# Patient Record
Sex: Female | Born: 1980 | Race: Black or African American | Hispanic: No | Marital: Single | Smoking: Never smoker
Health system: Southern US, Community
[De-identification: ages and names within clinical notes are randomized; demographics above are authoritative.]

## PROBLEM LIST (undated history)

## (undated) DIAGNOSIS — I10 Essential (primary) hypertension: Secondary | ICD-10-CM

---

## 2001-11-11 ENCOUNTER — Ambulatory Visit (HOSPITAL_COMMUNITY): Admission: RE | Admit: 2001-11-11 | Discharge: 2001-11-11 | Payer: Self-pay | Admitting: *Deleted

## 2002-01-07 ENCOUNTER — Inpatient Hospital Stay (HOSPITAL_COMMUNITY): Admission: AD | Admit: 2002-01-07 | Discharge: 2002-01-07 | Payer: Self-pay | Admitting: *Deleted

## 2002-01-19 ENCOUNTER — Encounter: Payer: Self-pay | Admitting: *Deleted

## 2002-01-19 ENCOUNTER — Inpatient Hospital Stay (HOSPITAL_COMMUNITY): Admission: AD | Admit: 2002-01-19 | Discharge: 2002-01-21 | Payer: Self-pay | Admitting: *Deleted

## 2002-02-17 ENCOUNTER — Inpatient Hospital Stay (HOSPITAL_COMMUNITY): Admission: AD | Admit: 2002-02-17 | Discharge: 2002-02-20 | Payer: Self-pay | Admitting: Obstetrics and Gynecology

## 2003-07-29 ENCOUNTER — Ambulatory Visit (HOSPITAL_COMMUNITY): Admission: RE | Admit: 2003-07-29 | Discharge: 2003-07-29 | Payer: Self-pay | Admitting: *Deleted

## 2003-09-19 ENCOUNTER — Inpatient Hospital Stay (HOSPITAL_COMMUNITY): Admission: AD | Admit: 2003-09-19 | Discharge: 2003-09-19 | Payer: Self-pay | Admitting: *Deleted

## 2003-10-06 ENCOUNTER — Inpatient Hospital Stay (HOSPITAL_COMMUNITY): Admission: AD | Admit: 2003-10-06 | Discharge: 2003-10-13 | Payer: Self-pay | Admitting: *Deleted

## 2003-10-06 ENCOUNTER — Ambulatory Visit: Payer: Self-pay | Admitting: *Deleted

## 2004-03-22 ENCOUNTER — Ambulatory Visit: Payer: Self-pay | Admitting: Family Medicine

## 2004-08-30 ENCOUNTER — Ambulatory Visit: Payer: Self-pay | Admitting: Family Medicine

## 2005-06-15 IMAGING — US US OB FOLLOW-UP
1 series · 13 of 28 positions shown · non-contrast
Comparison: none

CLINICAL DATA: 22-year-old.  G3 P1.  Check fluid.  Tachycardia at 33 weeks.

[Series 1: unknown · 0.32mm/px · 13 of 39 slices shown]
[im 2/39]
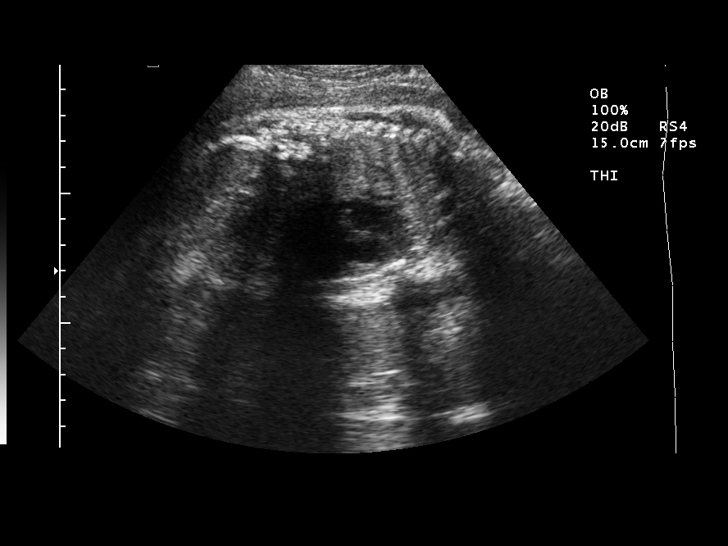
[im 5/39]
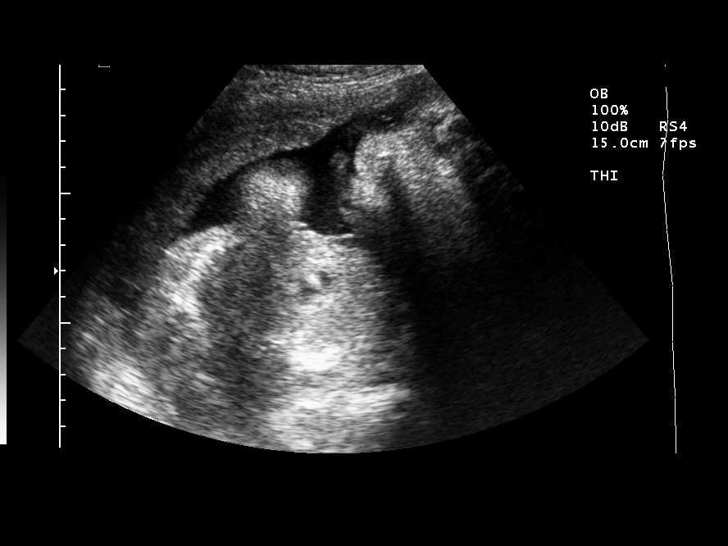
[im 8/39]
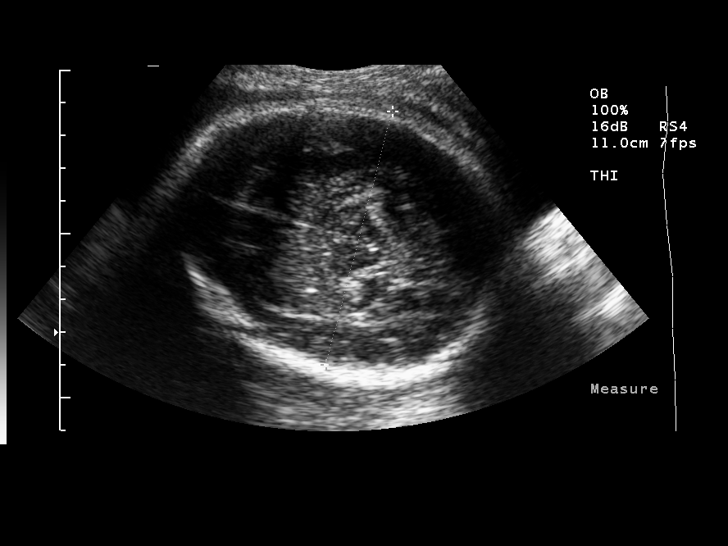
[im 10/39]
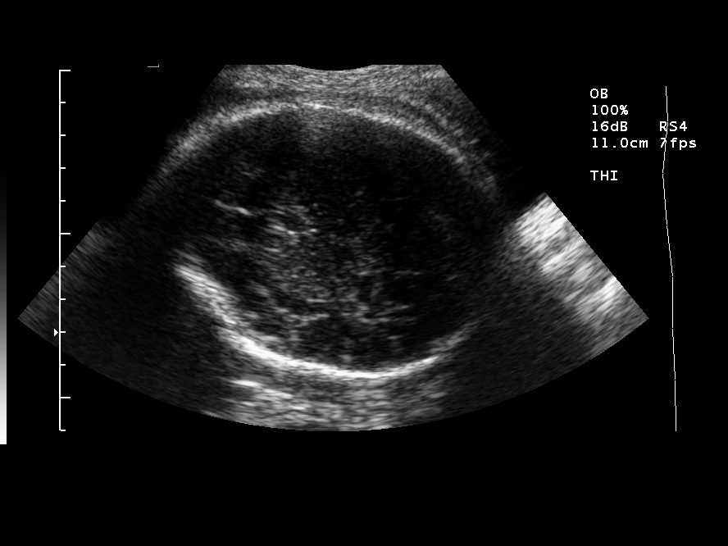
[im 13/39]
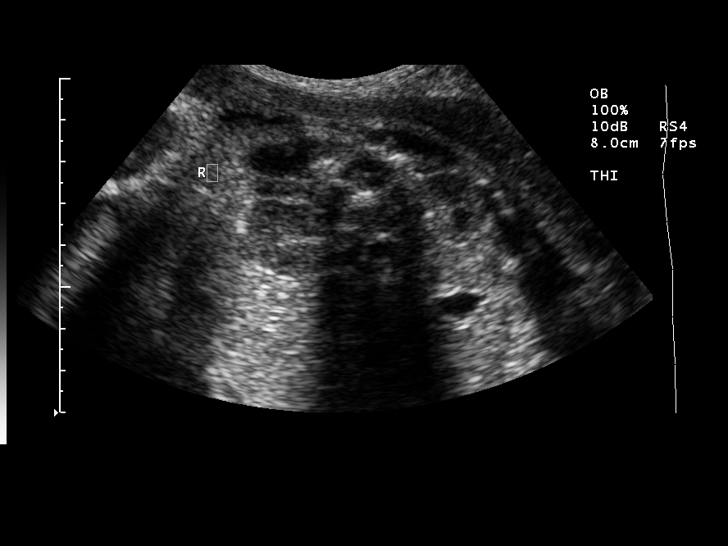
[im 16/39]
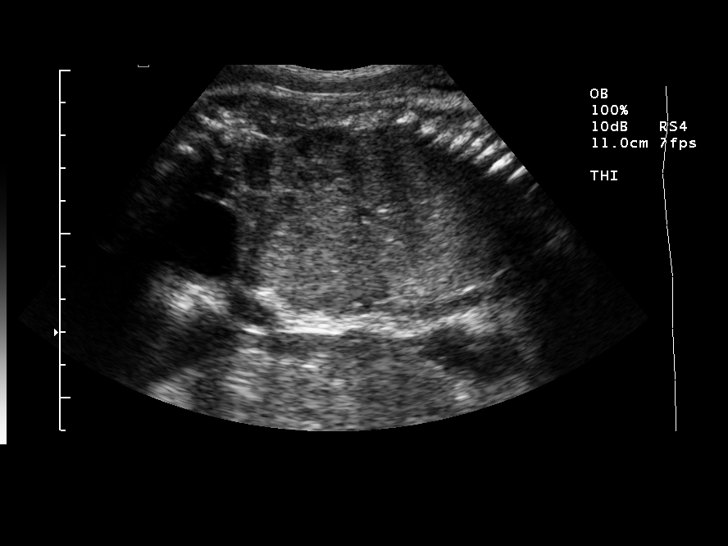
[im 20/39]
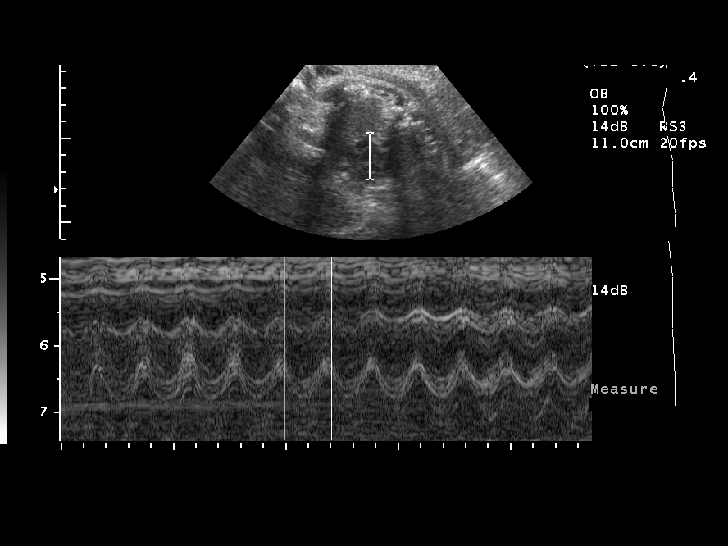
[im 23/39]
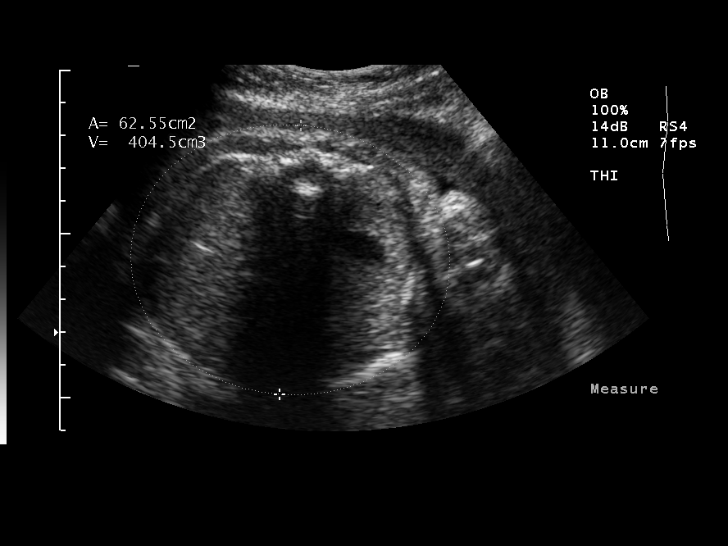
[im 26/39]
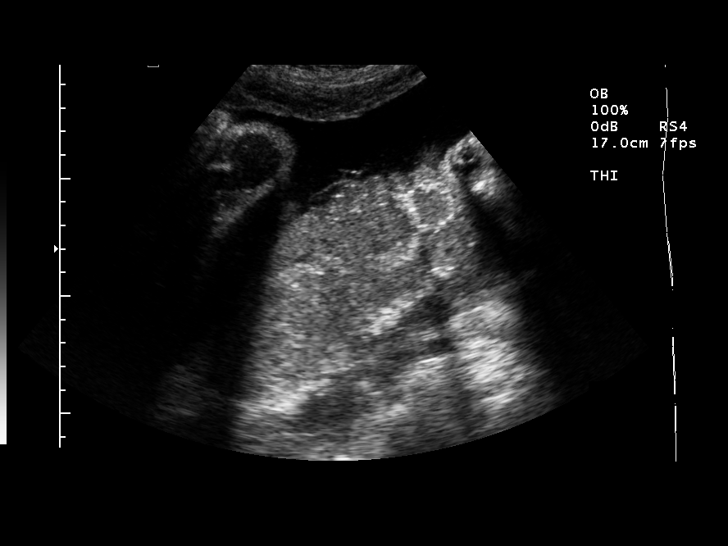
[im 29/39]
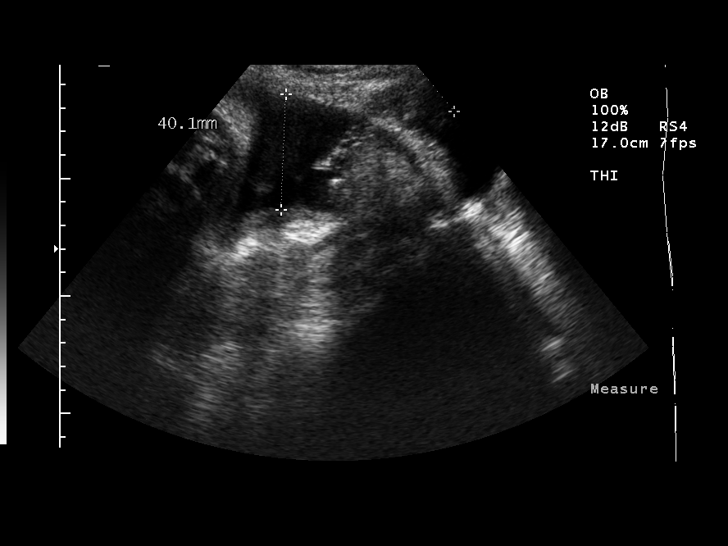
[im 31/39]
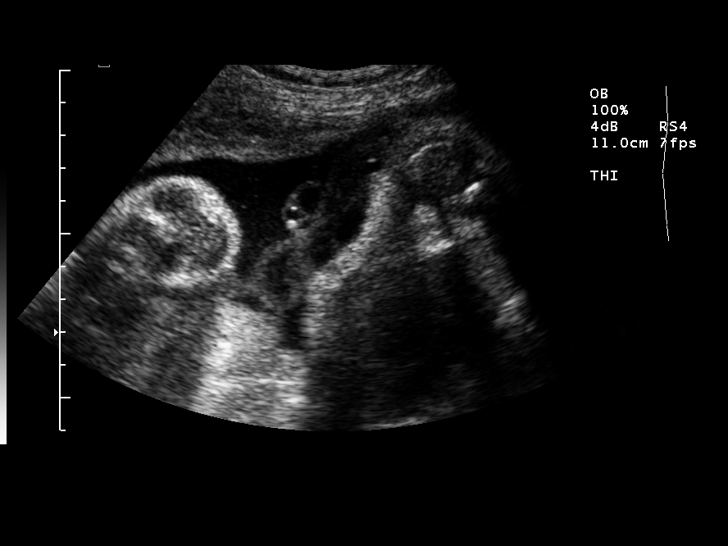
[im 34/39]
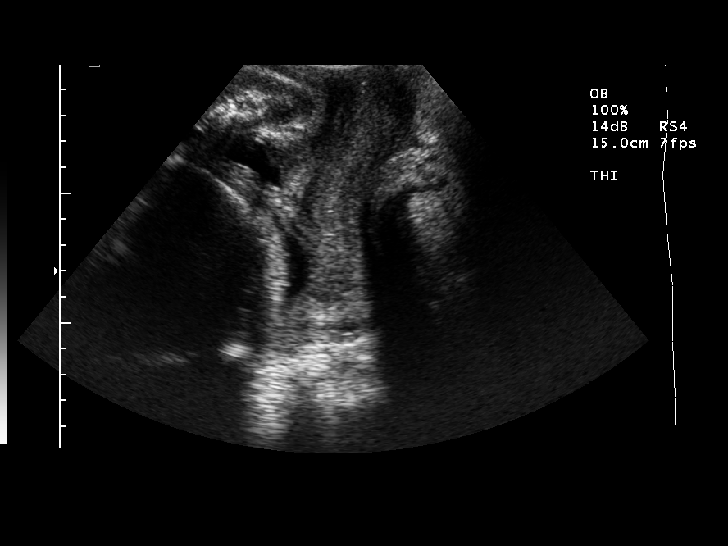
[im 37/39]
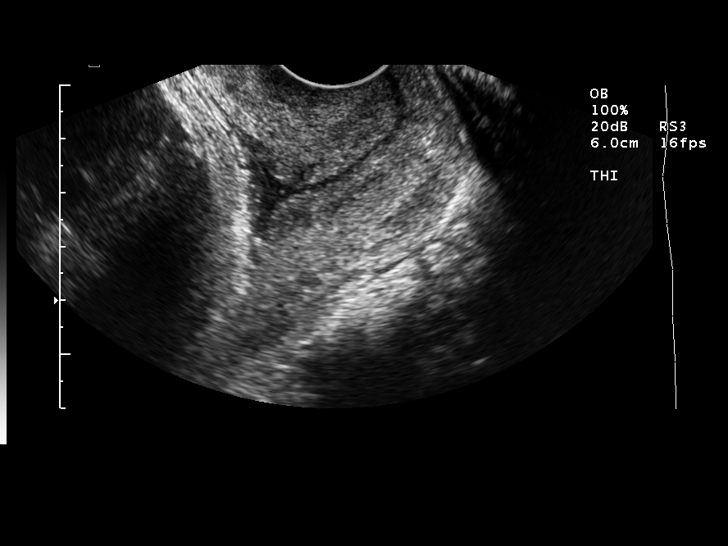

[13 of 28 positions shown; findings below may reference images not displayed]

OBSTETRICAL ULTRASOUND RE-EVALUATION WITH TRANSVAGINAL:
 Number of Fetuses:  1
 Heart Rate:  145/155
 Movement:  Yes
 Breathing:  No
 Presentation:  Cephalic
 Placental Location:  Posterior
 Grade:  II
 Previa:  No
 Amniotic Fluid (subjective):  Normal
 Amniotic Fluid (objective):  12.2 cm AFI (5th -95th%ile = 8.3 – 24.5 cm for 33 wks)

 FETAL BIOMETRY
 BPD:  8.0 cm  32 w 0 d
 HC:  31.0 cm   34 w 5 d
 AC:  27.6 cm   31 w 5 d
 FL:   5.9 cm   30 w 6 d

 Mean GA:  32 w 2 d
 Assigned GA:  33 w 3 d

 EFW:  0100 g (H) 25th – 50th%ile (1444 – 8388 g) For 33 wks

 FETAL ANATOMY
 Lateral Ventricles:    Visualized 
 Thalami/CSP:  Visualized 
 Posterior Fossa:  Previously seen 
 Nuchal Region:  N/A
 Spine:  Previously seen 
 4 Chamber Heart on Left:  Visualized 
 Stomach on Left:  Visualized 
 3 Vessel Cord:  Visualized 
 Cord Insertion Site:  Previously seen 
 Kidneys:  Visualized 
 Bladder:  Visualized 
 Extremities:  Previously seen 

 MATERNAL FINDINGS
 Cervix:  2.9 cm Transvaginally
IMPRESSION: Single living intrauterine fetus in cephalic presentation. Amniotic fluid volume is within normal limits.  Estimated fetal weight is in the 25th to 50th percentile.  Cervical length is slightly shortened.  No evidence for fetal tachycardia at time of exam.

## 2015-03-22 ENCOUNTER — Emergency Department
Admission: EM | Admit: 2015-03-22 | Discharge: 2015-03-22 | Disposition: A | Discharge status: Home / Self Care | Payer: 59 | Source: Home / Self Care | Attending: Family Medicine | Admitting: Family Medicine

## 2015-03-22 ENCOUNTER — Emergency Department
Admission: EM | Admit: 2015-03-22 | Discharge: 2015-03-22 | Disposition: A | Discharge status: Home / Self Care | Payer: Self-pay | Source: Home / Self Care

## 2015-03-22 ENCOUNTER — Encounter: Payer: Self-pay | Admitting: *Deleted

## 2015-03-22 DIAGNOSIS — Z9119 Patient's noncompliance with other medical treatment and regimen: Secondary | ICD-10-CM

## 2015-03-22 DIAGNOSIS — I10 Essential (primary) hypertension: Secondary | ICD-10-CM

## 2015-03-22 DIAGNOSIS — R52 Pain, unspecified: Secondary | ICD-10-CM

## 2015-03-22 DIAGNOSIS — Z91148 Patient's other noncompliance with medication regimen for other reason: Secondary | ICD-10-CM

## 2015-03-22 DIAGNOSIS — Z9114 Patient's other noncompliance with medication regimen: Secondary | ICD-10-CM

## 2015-03-22 DIAGNOSIS — R6889 Other general symptoms and signs: Secondary | ICD-10-CM

## 2015-03-22 DIAGNOSIS — R000 Tachycardia, unspecified: Secondary | ICD-10-CM

## 2015-03-22 HISTORY — DX: Essential (primary) hypertension: I10

## 2015-03-22 LAB — POCT INFLUENZA A/B
Influenza A, POC: NEGATIVE
Influenza B, POC: NEGATIVE

## 2015-03-22 MED ORDER — AMLODIPINE BESYLATE 5 MG PO TABS: 5.0000 mg | ORAL_TABLET | Freq: Every day | ORAL | Status: AC

## 2015-03-22 MED ORDER — OSELTAMIVIR PHOSPHATE 75 MG PO CAPS: 75.0000 mg | ORAL_CAPSULE | Freq: Two times a day (BID) | ORAL | Status: AC

## 2015-03-22 MED ORDER — IBUPROFEN 400 MG PO TABS
400.0000 mg | ORAL_TABLET | Freq: Once | ORAL | Status: AC
  Administered 2015-03-22: 400 mg via ORAL

## 2015-03-22 NOTE — ED Provider Notes (Signed)
CSN: [id number]     Arrival date & time 03/22/15  1959 History   First MD Initiated Contact with Patient 03/22/15 2028     Chief Complaint  Patient presents with  . Generalized Body Aches   (Consider location/radiation/quality/duration/timing/severity/associated sxs/prior Treatment) HPI  The pt is a 34yo female brought to [facility] by her mother with c/o fatigue and generalized body aches that started yesterday.  She has not taken any pain medication including acetaminophen or ibuprofen for her pain.  She notes her son was sick earlier this week with fever and vomiting but is now better.  She has not received the flu vaccine this year and is interested in trying Tamiflu.  She denies cough, congestion, n/v/d. Denies chest pain or SOB. No hx of asthma. Denies urinary symptoms. Denies recent travel or rashes.   Elevated blood pressure in triage.  Pt reports hx of HTN but notes she took herself off blood pressure medication about 2 years ago.  She does have a mild generalized headache but denies change in vision, chest pain, or extremity weakness or numbness.   Past Medical History  Diagnosis Date  . Hypertension    History reviewed. No pertinent past surgical history. History reviewed. No pertinent family history. Social History  Substance Use Topics  . Smoking status: Never Smoker   . Smokeless tobacco: None  . Alcohol Use: No   OB History    No data available     Review of Systems  Constitutional: Positive for fever, chills, appetite change and fatigue. Negative for diaphoresis.  HENT: Negative for congestion, rhinorrhea, sinus pressure and sore throat.   Respiratory: Negative for cough and shortness of breath.   Cardiovascular: Negative for chest pain and palpitations.  Gastrointestinal: Negative for vomiting, abdominal pain and diarrhea.  Genitourinary: Negative for dysuria, urgency, frequency, hematuria, flank pain and pelvic pain.  Musculoskeletal: Positive for myalgias and  arthralgias.       Body aches  Skin: Negative for color change and rash.  Neurological: Positive for headaches. Negative for dizziness, seizures, syncope, weakness, light-headedness and numbness.    Allergies  Review of patient's allergies indicates no known allergies.  Home Medications   Prior to Admission medications   Medication Sig Start Date End Date Taking? Authorizing Provider  amLODipine (NORVASC) 5 MG tablet Take 1 tablet (5 mg total) by mouth daily. 03/22/15   Junius Finner, PA-C  oseltamivir (TAMIFLU) 75 MG capsule Take 1 capsule (75 mg total) by mouth every 12 (twelve) hours. 03/22/15   Junius Finner, PA-C   Meds Ordered and Administered this Visit   Medications  ibuprofen (ADVIL,MOTRIN) tablet 400 mg (400 mg Oral Given 03/22/15 2037)    BP 163/112 mmHg  Pulse 119  Temp(Src) 100.4 F (38 C) (Oral)  Resp 16  Ht  (1.575 m)  Wt 110 lb (49.896 kg)  BMI 20.11 kg/m2  SpO2 99% No data found.   Physical Exam  Constitutional: She is oriented to person, place, and time. She appears well-developed and well-nourished. No distress.  Pt lying on exam table, appears mildly fatigued but alert. Cooperative during exam.   HENT:  Head: Normocephalic and atraumatic.  Right Ear: Tympanic membrane normal.  Left Ear: Tympanic membrane normal.  Nose: Mucosal edema present.  Mouth/Throat: Uvula is midline, oropharynx is clear and moist and mucous membranes are normal. No oropharyngeal exudate.  Eyes: Conjunctivae and EOM are normal. Pupils are equal, round, and reactive to light. No scleral icterus.  Neck: Normal range of  motion. Neck supple.  Cardiovascular: Regular rhythm and normal heart sounds.  Tachycardia present.   Tachycardia, regular rhythm  Pulmonary/Chest: Effort normal and breath sounds normal. No stridor. No respiratory distress. She has no wheezes. She has no rales.  Lungs: CTAB. No wheeze or rhonchi. No respiratory distress, able to speak in full sentences w/o  difficulty.   Abdominal: Soft. She exhibits no distension and no mass. There is no tenderness. There is no rebound and no guarding.  Musculoskeletal: Normal range of motion.  Lymphadenopathy:    She has no cervical adenopathy.  Neurological: She is alert and oriented to person, place, and time. Gait normal.  Speech is clear. Alert to person, place and time. Able to follow 2 step commands.   Skin: Skin is warm and dry. No rash noted. She is not diaphoretic. No erythema.  Nursing note and vitals reviewed.   ED Course  Procedures (including critical care time)  Labs Review Labs Reviewed  POCT INFLUENZA A/B    Imaging Review No results found.   MDM   1. Body aches   2. Flu-like symptoms   3. Essential hypertension   4. H/O medication noncompliance   5. Tachycardia    Pt c/o body aches and fatigue that started yesterday. Son sick earlier this week with GI symptoms. Pt denies GI symptoms. She did not receive flu vaccine this year but is willing to try Tamiflu even if Rapid flu is negative as test can have up to 50% false negative.    BP elevated, hx of HTN. She took herself off BP medication 2 years ago.  Mild generalized headache. Normal neuro exam.  HA was gradual in onset. Doubt SAH. No nuchal rigidity. No signs of meningitis.   Tachycardia with Temp of 100.4*F  HR gradually improving from 124 to 119 after pt given ibuprofen and ice water.  Pt denies chest pain or SOB. O2 Sat 99% on RA.  Doubt PE.  Pt likely has a viral illness, however, strongly encouraged pt to go to emergency department tonight if HA continues to worsen or other new concerning symptoms develop including chest pain or SOB as she may benefit from IV fluids and stat labs.  Rx: Tamiflu and amlodipine   Advised pt to use acetaminophen and ibuprofen as needed for fever and pain. Encouraged rest and fluids. F/u with PCP as soon as possible for close monitoring of her BP and for baseline routine labs if she does not  go to emergency department tonight. Pt, pt's mother, and her husband verbalized understanding and agreement with tx plan.     Junius Finner, PA-C 03/22/15 2224

## 2015-03-22 NOTE — Discharge Instructions (Signed)
You may take 400-600mg Ibuprofen (Motrin) every 6-8 hours for fever and pain  °Alternate with Tylenol  °You may take 500mg Tylenol every 4-6 hours as needed for fever and pain  °Follow-up with your primary care provider next week for recheck of symptoms if not improving.  °Be sure to drink plenty of fluids and rest, at least 8hrs of sleep a night, preferably more while you are sick. °Return urgent care or go to closest ER if you cannot keep down fluids/signs of dehydration, fever not reducing with Tylenol, difficulty breathing/wheezing, stiff neck, worsening condition, or other concerns (see below)  °Please take Tamiflu as prescribed and be sure to complete entire course even if you start to feel better to ensure infection does not come back. ° ° ° °

## 2015-03-22 NOTE — Plan of Care (Cosign Needed)
Called pt's home number.  Left message. Encouraged pt again to go to emergency department if her headache is not improving after ibuprofen and acetaminophen.  She may benefit from IV fluids and stat labs.  Encouraged to call urgent care in the morning with any questions or concerns.

## 2015-03-22 NOTE — ED Notes (Signed)
Pt c/o body aches, chills, and fever x 1 day.

## 2015-03-23 ENCOUNTER — Telehealth: Payer: Self-pay | Admitting: *Deleted

## 2015-04-04 ENCOUNTER — Emergency Department (HOSPITAL_BASED_OUTPATIENT_CLINIC_OR_DEPARTMENT_OTHER)
Admission: EM | Admit: 2015-04-04 | Discharge: 2015-04-04 | Disposition: A | Discharge status: Home / Self Care | Payer: 59 | Attending: Physician Assistant | Admitting: Physician Assistant

## 2015-04-04 ENCOUNTER — Encounter (HOSPITAL_BASED_OUTPATIENT_CLINIC_OR_DEPARTMENT_OTHER): Payer: Self-pay | Admitting: *Deleted

## 2015-04-04 DIAGNOSIS — G43809 Other migraine, not intractable, without status migrainosus: Secondary | ICD-10-CM | POA: Diagnosis not present

## 2015-04-04 DIAGNOSIS — R51 Headache: Secondary | ICD-10-CM | POA: Diagnosis present

## 2015-04-04 DIAGNOSIS — I10 Essential (primary) hypertension: Secondary | ICD-10-CM

## 2015-04-04 DIAGNOSIS — G8929 Other chronic pain: Secondary | ICD-10-CM | POA: Diagnosis not present

## 2015-04-04 DIAGNOSIS — Z79899 Other long term (current) drug therapy: Secondary | ICD-10-CM | POA: Insufficient documentation

## 2015-04-04 MED ORDER — SODIUM CHLORIDE 0.9 % IV BOLUS (SEPSIS)
1000.0000 mL | Freq: Once | INTRAVENOUS | Status: AC
  Administered 2015-04-04: 1000 mL via INTRAVENOUS

## 2015-04-04 MED ORDER — DIPHENHYDRAMINE HCL 50 MG/ML IJ SOLN
25.0000 mg | Freq: Once | INTRAMUSCULAR | Status: AC
  Administered 2015-04-04: 25 mg via INTRAVENOUS
  Filled 2015-04-04: qty 1

## 2015-04-04 MED ORDER — PROCHLORPERAZINE EDISYLATE 5 MG/ML IJ SOLN
10.0000 mg | Freq: Once | INTRAMUSCULAR | Status: AC
  Administered 2015-04-04: 10 mg via INTRAVENOUS
  Filled 2015-04-04: qty 2

## 2015-04-04 MED ORDER — KETOROLAC TROMETHAMINE 30 MG/ML IJ SOLN
30.0000 mg | Freq: Once | INTRAMUSCULAR | Status: AC
  Administered 2015-04-04: 30 mg via INTRAVENOUS
  Filled 2015-04-04: qty 1

## 2015-04-04 NOTE — Discharge Instructions (Signed)
How to Take Your Blood Pressure °HOW DO I GET A BLOOD PRESSURE MACHINE? °· You can buy an electronic home blood pressure machine at your local pharmacy. Insurance will sometimes cover the cost if you have a prescription. °· Ask your doctor what type of machine is best for you. There are different machines for your arm and your wrist. °· If you decide to buy a machine to check your blood pressure on your arm, first check the size of your arm so you can buy the right size cuff. To check the size of your arm:   °· Use a measuring tape that shows both inches and centimeters.   °· Wrap the measuring tape around the upper-middle part of your arm. You may need someone to help you measure.   °· Write down your arm measurement in both inches and centimeters.   °· To measure your blood pressure correctly, it is important to have the right size cuff.   °· If your arm is up to 13 inches (up to 34 centimeters), get an adult cuff size. °· If your arm is 13 to 17 inches (35 to 44 centimeters), get a large adult cuff size.   °·  If your arm is 17 to 20 inches (45 to 52 centimeters), get an adult thigh cuff.   °WHAT DO THE NUMBERS MEAN?  °· There are two numbers that make up your blood pressure. For example: 120/80. °· The first number (120 in our example) is called the "systolic pressure." It is a measure of the pressure in your blood vessels when your heart is pumping blood. °· The second number (80 in our example) is called the "diastolic pressure." It is a measure of the pressure in your blood vessels when your heart is resting between beats. °· Your doctor will tell you what your blood pressure should be. °WHAT SHOULD I DO BEFORE I CHECK MY BLOOD PRESSURE?  °· Try to rest or relax for at least 30 minutes before you check your blood pressure. °· Do not smoke. °· Do not have any drinks with caffeine, such as: °· Soda. °· Coffee. °· Tea. °· Check your blood pressure in a quiet room. °· Sit down and stretch out your arm on a table.  Keep your arm at about the level of your heart. Let your arm relax. °· Make sure that your legs are not crossed. °HOW DO I CHECK MY BLOOD PRESSURE? °· Follow the directions that came with your machine. °· Make sure you remove any tight-fitting clothing from your arm or wrist. Wrap the cuff around your upper arm or wrist. You should be able to fit a finger between the cuff and your arm. If you cannot fit a finger between the cuff and your arm, it is too tight and should be removed and rewrapped. °· Some units require you to manually pump up the arm cuff. °· Automatic units inflate the cuff when you press a button. °· Cuff deflation is automatic in both models. °· After the cuff is inflated, the unit measures your blood pressure and pulse. The readings are shown on a monitor. Hold still and breathe normally while the cuff is inflated. °· Getting a reading takes less than a minute. °· Some models store readings in a memory. Some provide a printout of readings. If your machine does not store your readings, keep a written record. °· Take readings with you to your next visit with your doctor. °  °This information is not intended to replace advice given to   you by your health care provider. Make sure you discuss any questions you have with your health care provider. °  °Document Released: 01/04/2008 Document Revised: 02/11/2014 Document Reviewed: 03/18/2013 °Elsevier Interactive Patient Education ©2016 [employer] Inc[employer] ° °Hypertension °Hypertension, commonly called high blood pressure, is when the force of blood pumping through your arteries is too strong. Your arteries are the blood vessels that carry blood from your heart throughout your body. A blood pressure reading consists of a higher number over a lower number, such as 110/72. The higher number (systolic) is the pressure inside your arteries when your heart pumps. The lower number (diastolic) is the pressure inside your arteries when your heart relaxes. Ideally you want  your blood pressure below 120/80. °Hypertension forces your heart to work harder to pump blood. Your arteries may become narrow or stiff. Having untreated or uncontrolled hypertension can cause heart attack, stroke, kidney disease, and other problems. °RISK FACTORS °Some risk factors for high blood pressure are controllable. Others are not.  °Risk factors you cannot control include:  °· Race. You may be at higher risk if you are African American. °· Age. Risk increases with age. °· Gender. Men are at higher risk than women before age 45 years. After age 65, women are at higher risk than men. °Risk factors you can control include: °· Not getting enough exercise or physical activity. °· Being overweight. °· Getting too much fat, sugar, calories, or salt in your diet. °· Drinking too much alcohol. °SIGNS AND SYMPTOMS °Hypertension does not usually cause signs or symptoms. Extremely high blood pressure (hypertensive crisis) may cause headache, anxiety, shortness of breath, and nosebleed. °DIAGNOSIS °To check if you have hypertension, your health care provider will measure your blood pressure while you are seated, with your arm held at the level of your heart. It should be measured at least twice using the same arm. Certain conditions can cause a difference in blood pressure between your right and left arms. A blood pressure reading that is higher than normal on one occasion does not mean that you need treatment. If it is not clear whether you have high blood pressure, you may be asked to return on a different day to have your blood pressure checked again. Or, you may be asked to monitor your blood pressure at home for 1 or more weeks. °TREATMENT °Treating high blood pressure includes making lifestyle changes and possibly taking medicine. Living a healthy lifestyle can help lower high blood pressure. You may need to change some of your habits. °Lifestyle changes may include: °· Following the DASH diet. This diet is high  in fruits, vegetables, and whole grains. It is low in salt, red meat, and added sugars. °· Keep your sodium intake below 2,300 mg per day. °· Getting at least 30-45 minutes of aerobic exercise at least 4 times per week. °· Losing weight if necessary. °· Not smoking. °· Limiting alcoholic beverages. °· Learning ways to reduce stress. °Your health care provider may prescribe medicine if lifestyle changes are not enough to get your blood pressure under control, and if one of the following is true: °· You are 18-59 years of age and your systolic blood pressure is above 140. °· You are 60 years of age or older, and your systolic blood pressure is above 150. °· Your diastolic blood pressure is above 90. °· You have diabetes, and your systolic blood pressure is over 140 or your diastolic blood pressure is over 90. °· You have kidney disease   and your blood pressure is above 140/90. °· You have heart disease and your blood pressure is above 140/90. °Your personal target blood pressure may vary depending on your medical conditions, your age, and other factors. °HOME CARE INSTRUCTIONS °· Have your blood pressure rechecked as directed by your health care provider.   °· Take medicines only as directed by your health care provider. Follow the directions carefully. Blood pressure medicines must be taken as prescribed. The medicine does not work as well when you skip doses. Skipping doses also puts you at risk for problems. °· Do not smoke.   °· Monitor your blood pressure at home as directed by your health care provider.  °SEEK MEDICAL CARE IF:  °· You think you are having a reaction to medicines taken. °· You have recurrent headaches or feel dizzy. °· You have swelling in your ankles. °· You have trouble with your vision. °SEEK IMMEDIATE MEDICAL CARE IF: °· You develop a severe headache or confusion. °· You have unusual weakness, numbness, or feel faint. °· You have severe chest or abdominal pain. °· You vomit repeatedly. °· You  have trouble breathing. °MAKE SURE YOU:  °· Understand these instructions. °· Will watch your condition. °· Will get help right away if you are not doing well or get worse. °  °This information is not intended to replace advice given to you by your health care provider. Make sure you discuss any questions you have with your health care provider. °  °Document Released: 01/21/2005 Document Revised: 06/07/2014 Document Reviewed: 11/13/2012 °Elsevier Interactive Patient Education ©2016 Elsevier Inc. ° °

## 2015-04-04 NOTE — ED Notes (Signed)
Ambulatory with steady gait at d/c. Note given for work. D/c with family

## 2015-04-04 NOTE — ED Notes (Signed)
MD at bedside. 

## 2015-04-04 NOTE — ED Notes (Signed)
Pt reports ha x yesterday, states "I know it is my blood pressure being up, I saw my doctor yesterday and she changed my blood pressure medicine, but I haven't had a chance to pick it up yet..." denies any visual changes, speech is clear, smile symmetrical, grips = and strong, moe + x 4 ext.

## 2015-04-04 NOTE — ED Provider Notes (Signed)
CSN: [id number]     Arrival date & time 04/04/15  0818 History   First MD Initiated Contact with Patient 04/04/15 [phone]     Chief Complaint  Patient presents with  . Hypertension     (Consider location/radiation/quality/duration/timing/severity/associated sxs/prior Treatment) HPI   She is a 35 year old female with past medical history significant for hypertension diagnosed 8 years ago. Patient's been on and off her medications. Patient has history of chronic migraine and chronic headaches. Patient's here today with a headache. She reports is going on for the last several days. When exploring further she really reports the headaches been on and off for the last several years.  She went to her primary care physician yesterday for a check in. They started her on a new anti-hypertensive medication. She has not filled the medication yet.  Patient is tearful on arrival concerned about her high blood pressure causing her headache.  She's had no chest pain. No neurologic symptoms. Headache feels similar to her headaches in the past.  Past Medical History  Diagnosis Date  . Hypertension    History reviewed. No pertinent past surgical history. History reviewed. No pertinent family history. Social History  Substance Use Topics  . Smoking status: Never Smoker   . Smokeless tobacco: None  . Alcohol Use: No   OB History    No data available     Review of Systems  Constitutional: Negative for activity change and fatigue.  HENT: Negative for congestion.   Eyes: Negative for discharge.  Respiratory: Negative for cough and chest tightness.   Cardiovascular: Negative for chest pain.  Gastrointestinal: Negative for vomiting, diarrhea, constipation and abdominal distention.  Genitourinary: Negative for dysuria.  Musculoskeletal: Negative for joint swelling.  Skin: Negative for rash.  Allergic/Immunologic: Negative for immunocompromised state.  Neurological: Positive for headaches. Negative  for seizures.  Psychiatric/Behavioral: Negative for agitation.      Allergies  Review of patient's allergies indicates no known allergies.  Home Medications   Prior to Admission medications   Medication Sig Start Date End Date Taking? Authorizing Provider  amLODipine (NORVASC) 5 MG tablet Take 1 tablet (5 mg total) by mouth daily. 03/22/15   Junius Finner, PA-C  oseltamivir (TAMIFLU) 75 MG capsule Take 1 capsule (75 mg total) by mouth every 12 (twelve) hours. 03/22/15   Junius Finner, PA-C   BP 142/105 mmHg  Pulse 90  Temp(Src) 98.4 F (36.9 C) (Oral)  Resp 16  Ht  (1.549 m)  Wt 110 lb (49.896 kg)  BMI 20.80 kg/m2  SpO2 100% Physical Exam  Constitutional: She is oriented to person, place, and time. She appears well-developed and well-nourished.  HENT:  Head: Normocephalic and atraumatic.  Eyes: Conjunctivae are normal. Right eye exhibits no discharge.  Neck: Neck supple.  Cardiovascular: Normal rate, regular rhythm and normal heart sounds.   No murmur heard. Pulmonary/Chest: Effort normal and breath sounds normal. She has no wheezes. She has no rales.  Abdominal: Soft. She exhibits no distension. There is no tenderness.  Musculoskeletal: Normal range of motion. She exhibits no edema.  Neurological: She is oriented to person, place, and time. No cranial nerve deficit.  Normal neurologic exam.  Skin: Skin is warm and dry. No rash noted. She is not diaphoretic.  Psychiatric: She has a normal mood and affect.  Nursing note and vitals reviewed.   ED Course  Procedures (including critical care time) Labs Review Labs Reviewed - No data to display  Imaging Review No results found. I have  personally reviewed and evaluated these images and lab results as part of my medical decision-making.   EKG Interpretation None      MDM   Final diagnoses:  None    Patient is a 35 year old female with history of severe hypertension who is been noncompliant with her  medications. She is presenting today with headaches. Patient has history of chronic headaches, chronic migraines. This headaches been going on for last 2-3 days. She recently saw her PCP who encouraged her to start on an antihypertensive. Patient has not had time to fill this yet.  Patient has no neurologic changes. She had blood work done yesterday. Patient's mildly tearful because she is concerned about her high blood pressure at this time. Given that this is been going on for such a long period of time I do not feel that  patient's best interest to acutely lower her blood pressure at this time. Patient's headache has been chronic. We will treat as a migraine.       12:26 PM Migraine treated and BP lowered. WIll have her follow up wthi PCP. And get her BP meds filled.  Feels much improved.   Isabella Roemmich Randall An, MD 04/04/15 1226

## 2022-08-15 ENCOUNTER — Other Ambulatory Visit: Payer: Self-pay

## 2022-08-15 ENCOUNTER — Emergency Department (HOSPITAL_BASED_OUTPATIENT_CLINIC_OR_DEPARTMENT_OTHER)
Admission: EM | Admit: 2022-08-15 | Discharge: 2022-08-15 | Disposition: A | Discharge status: Home / Self Care | Payer: 59 | Attending: Emergency Medicine | Admitting: Emergency Medicine

## 2022-08-15 ENCOUNTER — Emergency Department (HOSPITAL_BASED_OUTPATIENT_CLINIC_OR_DEPARTMENT_OTHER): Payer: 59

## 2022-08-15 ENCOUNTER — Encounter (HOSPITAL_BASED_OUTPATIENT_CLINIC_OR_DEPARTMENT_OTHER): Payer: Self-pay | Admitting: Emergency Medicine

## 2022-08-15 DIAGNOSIS — R079 Chest pain, unspecified: Secondary | ICD-10-CM | POA: Insufficient documentation

## 2022-08-15 DIAGNOSIS — I10 Essential (primary) hypertension: Secondary | ICD-10-CM | POA: Insufficient documentation

## 2022-08-15 DIAGNOSIS — R000 Tachycardia, unspecified: Secondary | ICD-10-CM | POA: Insufficient documentation

## 2022-08-15 DIAGNOSIS — Z9189 Other specified personal risk factors, not elsewhere classified: Secondary | ICD-10-CM

## 2022-08-15 LAB — COMPREHENSIVE METABOLIC PANEL
ALT: 20 U/L (ref 0–44)
AST: 19 U/L (ref 15–41)
Albumin: 4.6 g/dL (ref 3.5–5.0)
Alkaline Phosphatase: 71 U/L (ref 38–126)
Anion gap: 9 (ref 5–15)
BUN: 19 mg/dL (ref 6–20)
CO2: 29 mmol/L (ref 22–32)
Calcium: 9.7 mg/dL (ref 8.9–10.3)
Chloride: 99 mmol/L (ref 98–111)
Creatinine, Ser: 0.99 mg/dL (ref 0.44–1.00)
GFR, Estimated: >60 mL/min (ref >60)
Glucose, Bld: 146 mg/dL — ABNORMAL HIGH (ref 70–99)
Potassium: 3.9 mmol/L (ref 3.5–5.1)
Sodium: 137 mmol/L (ref 135–145)
Total Bilirubin: 0.3 mg/dL (ref 0.3–1.2)
Total Protein: 7.5 g/dL (ref 6.5–8.1)

## 2022-08-15 LAB — CBC
HCT: 41.6 % (ref 36.0–46.0)
Hemoglobin: 13.5 g/dL (ref 12.0–15.0)
MCH: 27.1 pg (ref 26.0–34.0)
MCHC: 32.5 g/dL (ref 30.0–36.0)
MCV: 83.5 fL (ref 80.0–100.0)
Platelets: 268 10*3/uL (ref 150–400)
RBC: 4.98 MIL/uL (ref 3.87–5.11)
RDW: 13.2 % (ref 11.5–15.5)
WBC: 6.5 10*3/uL (ref 4.0–10.5)
nRBC: 0 % (ref 0.0–0.2)

## 2022-08-15 LAB — D-DIMER, QUANTITATIVE: D-Dimer, Quant: 0.31 ug/mL-FEU (ref 0.00–0.50)

## 2022-08-15 LAB — TROPONIN I (HIGH SENSITIVITY): Troponin I (High Sensitivity): <2 ng/L (ref <18)

## 2022-08-15 LAB — PREGNANCY, URINE: Preg Test, Ur: NEGATIVE

## 2022-08-15 LAB — LIPASE, BLOOD: Lipase: 44 U/L (ref 11–51)

## 2022-08-15 MED ORDER — ACETAMINOPHEN 325 MG PO TABS: 650.0000 mg | ORAL_TABLET | Freq: Four times a day (QID) | ORAL | 0 refills | Status: AC | PRN

## 2022-08-15 MED ORDER — CYCLOBENZAPRINE HCL 10 MG PO TABS: 10.0000 mg | ORAL_TABLET | Freq: Two times a day (BID) | ORAL | 0 refills | Status: AC | PRN

## 2022-08-15 MED ORDER — ALUM & MAG HYDROXIDE-SIMETH 200-200-20 MG/5ML PO SUSP
30.0000 mL | Freq: Once | ORAL | Status: AC
  Administered 2022-08-15: 30 mL via ORAL
  Filled 2022-08-15: qty 30

## 2022-08-15 NOTE — ED Provider Notes (Signed)
[place] [facility] Provider Note  CSN: [id number] Arrival date & time: 08/15/22 0940  Chief Complaint(s) Chest Pain  HPI Ana Cowan is a 42 y.o. female with past medical history as below, significant for htn who presents to the ED with complaint of cp.  Onset of chest pain last night while at rest, resolved after approximately 2 hours.  Described as a cramping, aching sensation midsternal.  No associated nausea, vomiting, diaphoresis, dyspnea syncope or lightheadedness.  Symptoms resolved spontaneously and recurred this morning around 8 AM and are currently resolved.  He felt the pain was similar in character, cramping, aching sensation, occ radiates to back.  Slightly improved with lying down, not worsened with exertion.  Not described as pleuritic.  No recent travel or sick contacts no history of DVT or PE.  No change in p.o. intake.  No change in bowel or bladder function.  No illicit drug use, no excessive alcohol use.  Past Medical History Past Medical History:  Diagnosis Date   Hypertension    There are no problems to display for this patient.  Home Medication(s) Prior to Admission medications   Medication Sig Start Date End Date Taking? Authorizing Provider  acetaminophen (TYLENOL) 325 MG tablet Take 2 tablets (650 mg total) by mouth every 6 (six) hours as needed. 08/15/22  Yes Tanda Rockers A, DO  cyclobenzaprine (FLEXERIL) 10 MG tablet Take 1 tablet (10 mg total) by mouth 2 (two) times daily as needed for muscle spasms. 08/15/22  Yes Tanda Rockers A, DO  amLODipine (NORVASC) 5 MG tablet Take 1 tablet (5 mg total) by mouth daily. 03/22/15   Lurene Shadow, PA-C  oseltamivir (TAMIFLU) 75 MG capsule Take 1 capsule (75 mg total) by mouth every 12 (twelve) hours. 03/22/15   Rolla Plate                                                                                                                                    Past Surgical  History History reviewed. No pertinent surgical history. Family History History reviewed. No pertinent family history.  Social History Social History   Tobacco Use   Smoking status: Never    Passive exposure: Never  Vaping Use   Vaping status: Never Used  Substance Use Topics   Alcohol use: Yes   Drug use: No   Allergies Patient has no known allergies.  Review of Systems Review of Systems  Constitutional:  Negative for fever.  Respiratory:  Negative for chest tightness and shortness of breath.   Cardiovascular:  Positive for chest pain.  Gastrointestinal:  Negative for abdominal pain, nausea and vomiting.  All other systems reviewed and are negative.   Physical Exam Vital Signs  I have reviewed the triage vital signs BP 129/86 (BP Location: Left Arm)   Pulse (!) 105   Temp 98.2 F (36.8 C) (Oral)   Resp 16  Ht 5' (1.524 m)   Wt 56.7 kg   SpO2 100%   BMI 24.41 kg/m  Physical Exam Vitals and nursing note reviewed.  Constitutional:      General: She is not in acute distress.    Appearance: Normal appearance.  HENT:     Head: Normocephalic and atraumatic.     Right Ear: External ear normal.     Left Ear: External ear normal.     Nose: Nose normal.     Mouth/Throat:     Mouth: Mucous membranes are moist.  Eyes:     General: No scleral icterus.       Right eye: No discharge.        Left eye: No discharge.  Cardiovascular:     Rate and Rhythm: Regular rhythm. Tachycardia present.     Pulses: Normal pulses.     Heart sounds: Normal heart sounds.  Pulmonary:     Effort: Pulmonary effort is normal. No respiratory distress.     Breath sounds: Normal breath sounds. No wheezing.  Abdominal:     General: Abdomen is flat.     Palpations: Abdomen is soft.     Tenderness: There is no abdominal tenderness.  Musculoskeletal:     Cervical back: No rigidity.     Right lower leg: No edema.     Left lower leg: No edema.  Skin:    General: Skin is warm and dry.      Capillary Refill: Capillary refill takes less than 2 seconds.  Neurological:     Mental Status: She is alert.  Psychiatric:        Mood and Affect: Mood normal.        Behavior: Behavior normal.     ED Results and Treatments Labs (all labs ordered are listed, but only abnormal results are displayed) Labs Reviewed  COMPREHENSIVE METABOLIC PANEL - Abnormal; Notable for the following components:      Result Value   Glucose, Bld 146 (*)    All other components within normal limits  CBC  PREGNANCY, URINE  LIPASE, BLOOD  D-DIMER, QUANTITATIVE  TROPONIN I (HIGH SENSITIVITY)                                                                                                                          Radiology DG Chest 2 View  Result Date: 08/15/2022 CLINICAL DATA:  Provided history: Chest pain. EXAM: CHEST - 2 VIEW COMPARISON:  None. FINDINGS: Heart size within normal limits. No appreciable airspace consolidation. No evidence of pleural effusion or pneumothorax. No acute osseous abnormality identified. Partially imaged dextrocurvature of the lumbar spine. IMPRESSION: No evidence of active cardiopulmonary disease. Electronically Signed   By: Jackey Loge D.O.   On: 08/15/2022 11:16    Pertinent labs & imaging results that were available during my care of the patient were reviewed by me and considered in my medical decision making (see MDM for details).  Medications Ordered in ED Medications  alum & mag hydroxide-simeth (MAALOX/MYLANTA) 200-200-20 MG/5ML suspension 30 mL (30 mLs Oral Given 08/15/22 1135)                                                                                                                                     Procedures Procedures  (including critical care time)  Medical Decision Making / ED Course    Medical Decision Making:    Blythe Veach is a 42 y.o. female with past medical history as below, significant for htn who presents to the ED with complaint of cp.  . The complaint involves an extensive differential diagnosis and also carries with it a high risk of complications and morbidity.  Serious etiology was considered. Ddx includes but is not limited to: Differential includes all life-threatening causes for chest pain. This includes but is not exclusive to acute coronary syndrome, aortic dissection, pulmonary embolism, cardiac tamponade, community-acquired pneumonia, pericarditis, musculoskeletal chest wall pain, etc.   Complete initial physical exam performed, notably the patient  was she is tachycardic, no current chest pain, no dyspnea at this time.  HDS.Marland Kitchen    Reviewed and confirmed nursing documentation for past medical history, family history, social history.  Vital signs reviewed.    Clinical Course as of 08/15/22 1222  Thu Aug 15, 2022  1130 D-Dimer, Quant: 0.31 PE unlikely, well's score low [SG]    Clinical Course User Index [SG] Sloan Leiter, DO    The patient's chest pain is not suggestive of pulmonary embolus, cardiac ischemia, aortic dissection, pericarditis, myocarditis, pulmonary embolism, pneumothorax, pneumonia, Zoster, or esophageal perforation, or other serious etiology.  Historically not abrupt in onset, tearing or ripping, pulses symmetric. EKG nonspecific for ischemia/infarction. No dysrhythmias, brugada, WPW, prolonged QT noted.   Troponin negative. CXR reviewed. Labs without demonstration of acute pathology unless otherwise noted above. Low HEART Score: 0-3 points (0.9-1.7% risk of MACE).  Given the extremely low risk of these diagnoses further testing and evaluation for these possibilities does not appear to be indicated at this time. Patient in no distress and overall condition improved here in the ED. Detailed discussions were had with the patient regarding current findings, and need for close f/u with PCP or on call doctor. The patient has been instructed to return immediately if the symptoms worsen in any way for  re-evaluation. Patient verbalized understanding and is in agreement with current care plan. All questions answered prior to discharge.   Additional history obtained: -Additional history obtained from mother -External records from outside source obtained and reviewed including: Chart review including previous notes, labs, imaging, consultation notes including primary crepitation, home medications   Lab Tests: -I ordered, reviewed, and interpreted labs.   The pertinent results include:   Labs Reviewed  COMPREHENSIVE METABOLIC PANEL - Abnormal; Notable for the following components:      Result Value   Glucose, Bld 146 (*)    All other components within normal  limits  CBC  PREGNANCY, URINE  LIPASE, BLOOD  D-DIMER, QUANTITATIVE  TROPONIN I (HIGH SENSITIVITY)    Notable for neg trop, neg dimer  EKG   EKG Interpretation Date/Time:    Ventricular Rate:    PR Interval:    QRS Duration:    QT Interval:    QTC Calculation:   R Axis:      Text Interpretation:           Imaging Studies ordered I ordered imaging studies including CXR I independently visualized the following imaging with scope of interpretation limited to determining acute life threatening conditions related to emergency care; findings noted above, significant for no ptx or large pna, wnl I independently visualized and interpreted imaging. I agree with the radiologist interpretation   Medicines ordered and prescription drug management: Meds ordered this encounter  Medications   alum & mag hydroxide-simeth (MAALOX/MYLANTA) 200-200-20 MG/5ML suspension 30 mL   cyclobenzaprine (FLEXERIL) 10 MG tablet    Sig: Take 1 tablet (10 mg total) by mouth 2 (two) times daily as needed for muscle spasms.    Dispense:  10 tablet    Refill:  0   acetaminophen (TYLENOL) 325 MG tablet    Sig: Take 2 tablets (650 mg total) by mouth every 6 (six) hours as needed.    Dispense:  36 tablet    Refill:  0    -I have reviewed the  patients home medicines and have made adjustments as needed   Consultations Obtained: a   Cardiac Monitoring: The patient was maintained on a cardiac monitor.  I personally viewed and interpreted the cardiac monitored which showed an underlying rhythm of: NSR Continuous pulse oximetry interpreted by myself 99 to 100% on ambient  Social Determinants of Health:  Diagnosis or treatment significantly limited by social determinants of health: non smoker   Reevaluation: After the interventions noted above, I reevaluated the patient and found that they have improved  Co morbidities that complicate the patient evaluation  Past Medical History:  Diagnosis Date   Hypertension       Dispostion: Disposition decision including need for hospitalization was considered, and patient discharged from emergency department.    Final Clinical Impression(s) / ED Diagnoses Final diagnoses:  Acute nonspecific chest pain with low risk of coronary artery disease     This chart was dictated using voice recognition software.  Despite best efforts to proofread,  errors can occur which can change the documentation meaning.    Tanda Rockers A, DO 08/15/22 1222

## 2022-08-15 NOTE — Discharge Instructions (Addendum)
It was a pleasure caring for you today in the emergency department.  Please return to the emergency department for any worsening or worrisome symptoms.  Do not take flexeril/cyclobenzaprine IF YOU BECOME PREGNANT

## 2022-08-15 NOTE — ED Triage Notes (Signed)
Cp started last night , denies N/v/d or sob no injury  , aching pain
